# Patient Record
Sex: Female | Born: 1965 | Race: White | Hispanic: No | Marital: Married | Smoking: Former smoker
Health system: Southern US, Community
[De-identification: ages and names within clinical notes are randomized; demographics above are authoritative.]

## PROBLEM LIST (undated history)

## (undated) DIAGNOSIS — F419 Anxiety disorder, unspecified: Secondary | ICD-10-CM

## (undated) DIAGNOSIS — F4310 Post-traumatic stress disorder, unspecified: Secondary | ICD-10-CM

## (undated) DIAGNOSIS — K589 Irritable bowel syndrome without diarrhea: Secondary | ICD-10-CM

## (undated) DIAGNOSIS — I10 Essential (primary) hypertension: Secondary | ICD-10-CM

## (undated) HISTORY — PX: BREAST SURGERY: SHX581

## (undated) HISTORY — PX: ABDOMINAL HYSTERECTOMY: SHX81

## (undated) HISTORY — PX: TONSILLECTOMY: SUR1361

---

## 2008-09-12 ENCOUNTER — Ambulatory Visit: Payer: Self-pay | Admitting: Family Medicine

## 2008-09-12 DIAGNOSIS — I10 Essential (primary) hypertension: Secondary | ICD-10-CM | POA: Insufficient documentation

## 2008-09-12 DIAGNOSIS — R197 Diarrhea, unspecified: Secondary | ICD-10-CM | POA: Insufficient documentation

## 2008-09-12 DIAGNOSIS — R112 Nausea with vomiting, unspecified: Secondary | ICD-10-CM | POA: Insufficient documentation

## 2008-09-12 DIAGNOSIS — E559 Vitamin D deficiency, unspecified: Secondary | ICD-10-CM | POA: Insufficient documentation

## 2008-09-13 LAB — CONVERTED CEMR LAB
ALT: 9 units/L (ref 0–35)
AST: 14 units/L (ref 0–37)
Albumin: 4.2 g/dL (ref 3.5–5.2)
BUN: 11 mg/dL (ref 6–23)
Basophils Relative: 0 % (ref 0–1)
CO2: 22 meq/L (ref 19–32)
Chloride: 108 meq/L (ref 96–112)
Creatinine, Ser: 0.69 mg/dL (ref 0.40–1.20)
Eosinophils Relative: 2 % (ref 0–5)
HCT: 43.8 % (ref 36.0–46.0)
Hemoglobin: 15.1 g/dL — ABNORMAL HIGH (ref 12.0–15.0)
Lymphocytes Relative: 30 % (ref 12–46)
MCHC: 34.5 g/dL (ref 30.0–36.0)
Monocytes Absolute: 0.5 10*3/uL (ref 0.1–1.0)
Monocytes Relative: 5 % (ref 3–12)
Neutro Abs: 5.5 10*3/uL (ref 1.7–7.7)
RBC: 4.51 M/uL (ref 3.87–5.11)
Total Bilirubin: 0.6 mg/dL (ref 0.3–1.2)
Total Protein: 6.8 g/dL (ref 6.0–8.3)
Vit D, 1,25-Dihydroxy: 29 — ABNORMAL LOW (ref 30–89)

## 2008-09-15 ENCOUNTER — Encounter: Payer: Self-pay | Admitting: Family Medicine

## 2008-10-13 ENCOUNTER — Ambulatory Visit: Payer: Self-pay | Admitting: Gastroenterology

## 2008-10-13 DIAGNOSIS — N200 Calculus of kidney: Secondary | ICD-10-CM | POA: Insufficient documentation

## 2008-10-13 DIAGNOSIS — N390 Urinary tract infection, site not specified: Secondary | ICD-10-CM | POA: Insufficient documentation

## 2008-10-13 DIAGNOSIS — Z8719 Personal history of other diseases of the digestive system: Secondary | ICD-10-CM | POA: Insufficient documentation

## 2008-10-13 DIAGNOSIS — R634 Abnormal weight loss: Secondary | ICD-10-CM | POA: Insufficient documentation

## 2008-10-13 DIAGNOSIS — F411 Generalized anxiety disorder: Secondary | ICD-10-CM | POA: Insufficient documentation

## 2008-10-13 LAB — CONVERTED CEMR LAB: CA 125: 3.5 units/mL (ref 0.0–30.2)

## 2008-10-27 ENCOUNTER — Ambulatory Visit: Payer: Self-pay | Admitting: Gastroenterology

## 2012-12-16 ENCOUNTER — Encounter: Payer: Self-pay | Admitting: *Deleted

## 2012-12-16 ENCOUNTER — Emergency Department (INDEPENDENT_AMBULATORY_CARE_PROVIDER_SITE_OTHER): Payer: Self-pay

## 2012-12-16 ENCOUNTER — Ambulatory Visit (INDEPENDENT_AMBULATORY_CARE_PROVIDER_SITE_OTHER): Payer: Self-pay | Admitting: Sports Medicine

## 2012-12-16 ENCOUNTER — Emergency Department
Admission: EM | Admit: 2012-12-16 | Discharge: 2012-12-16 | Disposition: A | Discharge status: Home / Self Care | Payer: Self-pay | Source: Home / Self Care | Attending: Family Medicine | Admitting: Family Medicine

## 2012-12-16 DIAGNOSIS — M25511 Pain in right shoulder: Secondary | ICD-10-CM

## 2012-12-16 DIAGNOSIS — M25519 Pain in unspecified shoulder: Secondary | ICD-10-CM

## 2012-12-16 DIAGNOSIS — M5412 Radiculopathy, cervical region: Secondary | ICD-10-CM

## 2012-12-16 DIAGNOSIS — M542 Cervicalgia: Secondary | ICD-10-CM

## 2012-12-16 HISTORY — DX: Essential (primary) hypertension: I10

## 2012-12-16 HISTORY — DX: Post-traumatic stress disorder, unspecified: F43.10

## 2012-12-16 HISTORY — DX: Irritable bowel syndrome, unspecified: K58.9

## 2012-12-16 HISTORY — DX: Anxiety disorder, unspecified: F41.9

## 2012-12-16 MED ORDER — PREDNISONE 10 MG PO TABS: ORAL_TABLET | ORAL | Status: AC

## 2012-12-16 MED ORDER — ORPHENADRINE CITRATE ER 100 MG PO TB12: 100.0000 mg | ORAL_TABLET | Freq: Two times a day (BID) | ORAL | Status: AC

## 2012-12-16 MED ORDER — MELOXICAM 15 MG PO TABS: ORAL_TABLET | ORAL | Status: AC

## 2012-12-16 NOTE — ED Provider Notes (Signed)
History     CSN: [id number]  Arrival date & time 12/16/12  1113   First MD Initiated Contact with Patient 12/16/12 1114      Chief Complaint  Patient presents with  . Shoulder Pain    right   HPI  Shoulder pain x 1 week.  Predominantly R sided.  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 No known injury.  Hash had limited ROM 2/2 pain.  Has had some radicular sxs down r arm into hand.  Grip strength intact.  No neck pain.  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   Past Medical History  Diagnosis Date  . Hypertension   . IBS (irritable bowel syndrome)   . PTSD (post-traumatic stress disorder)   . Anxiety     Past Surgical History  Procedure Laterality Date  . Breast surgery    . Tonsillectomy    . Abdominal hysterectomy      Family History  Problem Relation Age of Onset  . Lymphoma Mother     History  Substance Use Topics  . Smoking status: Former Smoker    Types: Cigarettes    Quit date: 05/18/2012  . Smokeless tobacco: Not on file  . Alcohol Use: Not on file    OB History   Grav Para Term Preterm Abortions TAB SAB Ect Mult Living                  Review of Systems  All other systems reviewed and are negative.    Allergies  Erythromycin and Sulfonamide derivatives  Home Medications   Current Outpatient Rx  Name  Route  Sig  Dispense  Refill  . clonazePAM (KLONOPIN) 1 MG tablet   Oral   Take 1 mg by mouth 2 (two) times daily as needed for anxiety.         . hydrALAZINE (APRESOLINE) 25 MG tablet   Oral   Take 25 mg by mouth 3 (three) times daily.         . sertraline (ZOLOFT) 100 MG tablet   Oral   Take 100 mg by mouth daily.           BP 129/89  Pulse 74  Ht 5\' 6"  (1.676 m)  Wt 130 lb (58.968 kg)  BMI 20.99 kg/m2  SpO2 99%  Physical Exam  Constitutional: She appears well-developed and well-nourished.  HENT:  Head: Normocephalic and  atraumatic.  Eyes: Conjunctivae are normal. Pupils are equal, round, and reactive to light.  Neck: Normal range of motion.  Cardiovascular: Normal rate and regular rhythm.   Pulmonary/Chest: Effort normal.  Abdominal: Soft.  Musculoskeletal:  + pain with external rotation  + pain with arm abduction >60  Neurovascularly intact distally  Grip strength intact spurlings negative.  Mild pain with general neck rotation that lateralizes to either side.    Neurological: She is alert.  Skin: Skin is warm.    ED Course  Procedures (including critical care time)  Labs Reviewed - No data to display Dg Cervical Spine Complete  12/16/2012  *RADIOLOGY REPORT*  Clinical Data: Neck and shoulder pain, no injury  CERVICAL SPINE - COMPLETE 4+ VIEW  Comparison: None.  Findings: The cervical vertebrae are straightened in alignment. Intervertebral disc spaces appear normal.  No prevertebral soft tissue swelling is seen.  On oblique views no significant foraminal narrowing is noted.  The lung apices are clear.  The odontoid  process is intact.  IMPRESSION: Straightened alignment.  Normal disc spaces.  No acute abnormality.   Original Report Authenticated By: Dwyane Dee, M.D.    Dg Shoulder Right  12/16/2012  *RADIOLOGY REPORT*  Clinical Data: Shoulder and neck pain, no injury  RIGHT SHOULDER - 2+ VIEW  Comparison: None.  Findings: The right humeral head is normally positioned within the glenohumeral joint space.  No significant degenerative change is seen and the right [facility] joint is normally aligned.  IMPRESSION: Negative.   Original Report Authenticated By: Dwyane Dee, M.D.      1. Pain in joint, shoulder region, right       MDM  Ddx includes rotator cuff pathology vs. Cervical radiculopathy.  Will likely benefit from course of oral steroids.  Will consult sports medicine as they are available.  Treatment and follow up per sports medicine.     The patient and/or caregiver has been counseled  thoroughly with regard to treatment plan and/or medications prescribed including dosage, schedule, interactions, rationale for use, and possible side effects and they verbalize understanding. Diagnoses and expected course of recovery discussed and will return if not improved as expected or if the condition worsens. Patient and/or caregiver verbalized understanding.             Doree Albee, MD 12/16/12 1246

## 2012-12-16 NOTE — Progress Notes (Signed)
  Subjective:    I'm seeing this patient as a consultation for:  Dr. Alvester Morin  CC: Right shoulder pain   HPI: Theresa Adams is a very pleasant 47 year old female with several year history of on-and-off pain she localizes over her right trapezius radiating down the lateral aspect of her right upper arm, forearm, and causing numbness and tingling in the first 3 fingers. This bothers her significantly at night. Symptoms are worse with most neck movements, but also reproduced with overhead activities of the right shoulder. She's used to oral analgesics, and has been using some Flexeril and she has left over at home but unfortunately has not yet had any relief. She denies any bowel or bladder changes or saddle anesthesia or constitutional symptoms.  Past medical history, Surgical history, Family history not pertinant except as noted below, Social history, Allergies, and medications have been entered into the medical record, reviewed, and no changes needed.   Review of Systems: No headache, visual changes, nausea, vomiting, diarrhea, constipation, dizziness, abdominal pain, skin rash, fevers, chills, night sweats, weight loss, swollen lymph nodes, body aches, joint swelling, muscle aches, chest pain, shortness of breath, mood changes, visual or auditory hallucinations.   Objective:   General: Well Developed, well nourished, and in no acute distress.  Neuro/Psych: Alert and oriented x3, extra-ocular muscles intact, able to move all 4 extremities, sensation grossly intact. Skin: Warm and dry, no rashes noted.  Respiratory: Not using accessory muscles, speaking in full sentences, trachea midline.  Cardiovascular: Pulses palpable, no extremity edema. Abdomen: Does not appear distended. Neck: Inspection unremarkable. No palpable stepoffs. Positive Spurling's maneuver. Full neck range of motion Grip strength and sensation normal in bilateral hands Strength weak to right C5 and C6. Mild hypoesthesia in the C5  and C6 distribution on the right side. Negative Hoffman sign bilaterally Reflexes normal She did have reproduction of pain with some of the rotator cuff impingement tests.  X-rays show mid cervical degenerative disc disease with abnormal curvature of the cervical spine suggesting spasm.  Shoulder x-rays were unremarkable.  Impression and Recommendations:   This case required medical decision making of moderate complexity.

## 2012-12-16 NOTE — ED Notes (Signed)
Theresa Adams c/o right shoulder pain without known cause x 5-6 days. Limited ROM, burning radiates down right arm. Used Aleve, flexeril, ultram, ice and heat without relief.

## 2012-12-16 NOTE — Assessment & Plan Note (Signed)
Symptoms predominately represent a C5 or C6 right-sided cervical radiculitis. She also has some rotator cuff symptoms. Prednisone, Norflex, formal physical therapy, Mobic. Return in 4 weeks, MRI if no better.

## 2012-12-17 ENCOUNTER — Ambulatory Visit: Payer: Self-pay | Admitting: Physical Therapy

## 2012-12-18 ENCOUNTER — Ambulatory Visit: Payer: Self-pay | Admitting: Physical Therapy

## 2012-12-23 ENCOUNTER — Ambulatory Visit: Payer: Self-pay | Admitting: Physical Therapy

## 2012-12-30 ENCOUNTER — Ambulatory Visit: Payer: Self-pay | Admitting: Physical Therapy

## 2013-11-02 ENCOUNTER — Encounter: Payer: Self-pay | Admitting: Emergency Medicine

## 2013-11-02 ENCOUNTER — Emergency Department
Admission: EM | Admit: 2013-11-02 | Discharge: 2013-11-02 | Disposition: A | Discharge status: Home / Self Care | Payer: Self-pay | Source: Home / Self Care | Attending: Family Medicine | Admitting: Family Medicine

## 2013-11-02 DIAGNOSIS — H6690 Otitis media, unspecified, unspecified ear: Secondary | ICD-10-CM

## 2013-11-02 MED ORDER — AMOXICILLIN 875 MG PO TABS: 875.0000 mg | ORAL_TABLET | Freq: Two times a day (BID) | ORAL | Status: AC

## 2013-11-02 MED ORDER — NEOMYCIN-POLYMYXIN-HC 3.5-10000-1 OT SOLN: 3.0000 [drp] | Freq: Four times a day (QID) | OTIC | Status: AC

## 2013-11-02 NOTE — ED Notes (Signed)
Pt c/o LT ear pain x today. She also c/o URI s/s x 2 days. Denies fever.

## 2013-11-25 NOTE — ED Provider Notes (Signed)
CSN: [id number]     Arrival date & time 11/02/13  1525 History   First MD Initiated Contact with Patient 11/02/13 1529     Chief Complaint  Patient presents with  . Otalgia     HPI  EAR PAIN Location:  L ear  Description: L ear pain and discomfort Onset:  1 day  Modifying factors: none   Symptoms  Sensation of fullness: no Ear discharge: no URI symptoms: minimal   Fever: no Tinnitus:no   Dizziness:no   Hearing loss:no   Toothache: no Rashes or lesions: no Facial muscle weakness: no  Red Flags Recent trauma: no PMH prior ear surgery:  no Diabetes or Immunosuppresion: no    Past Medical History  Diagnosis Date  . Hypertension   . IBS (irritable bowel syndrome)   . PTSD (post-traumatic stress disorder)   . Anxiety    Past Surgical History  Procedure Laterality Date  . Breast surgery    . Tonsillectomy    . Abdominal hysterectomy     Family History  Problem Relation Age of Onset  . Lymphoma Mother    History  Substance Use Topics  . Smoking status: Former Smoker    Types: Cigarettes    Quit date: 05/18/2012  . Smokeless tobacco: Not on file  . Alcohol Use: Yes   OB History   Grav Para Term Preterm Abortions TAB SAB Ect Mult Living                 Review of Systems  All other systems reviewed and are negative.      Allergies  Erythromycin and Sulfonamide derivatives  Home Medications   Current Outpatient Rx  Name  Route  Sig  Dispense  Refill  . amoxicillin (AMOXIL) 875 MG tablet   Oral   Take 1 tablet (875 mg total) by mouth 2 (two) times daily.   20 tablet   0   . clonazePAM (KLONOPIN) 1 MG tablet   Oral   Take 1 mg by mouth 2 (two) times daily as needed for anxiety.         . hydrALAZINE (APRESOLINE) 25 MG tablet   Oral   Take 25 mg by mouth 3 (three) times daily.         . meloxicam (MOBIC) 15 MG tablet      One tab PO qAM with breakfast for 2 weeks, then daily prn pain.   30 tablet   3   . orphenadrine (NORFLEX) 100  MG tablet   Oral   Take 1 tablet (100 mg total) by mouth 2 (two) times daily.   60 tablet   0   . predniSONE (DELTASONE) 10 MG tablet      4 tabs po daily x 6 days, then 3 tabs po daily x 4 days, then 2 tabs po daily x 4 days, then 1 tab po daily x 4 days   48 tablet   0   . sertraline (ZOLOFT) 100 MG tablet   Oral   Take 100 mg by mouth daily.          BP 150/89  Pulse 74  Temp(Src) 98 F (36.7 C) (Oral)  Resp 16  Ht 5\' 6"  (1.676 m)  Wt 129 lb (58.514 kg)  BMI 20.83 kg/m2  SpO2 99% Physical Exam  Constitutional: She is oriented to person, place, and time. She appears well-developed and well-nourished.  HENT:  Head: Normocephalic and atraumatic.  L ear TM bulging   Eyes:  Conjunctivae are normal. Pupils are equal, round, and reactive to light.  Neck: Normal range of motion. Neck supple.  Cardiovascular: Normal rate and regular rhythm.   Pulmonary/Chest: Effort normal and breath sounds normal.  Abdominal: Soft.  Neurological: She is alert and oriented to person, place, and time.  Skin: Skin is warm.    ED Course  Procedures (including critical care time) Labs Review Labs Reviewed - No data to display Imaging Review No results found.    MDM   Final diagnoses:  AOM (acute otitis media)    Will treat with amox Discussed supportive care and infectious/ENT red flags.  Follow up as needed.     The patient and/or caregiver has been counseled thoroughly with regard to treatment plan and/or medications prescribed including dosage, schedule, interactions, rationale for use, and possible side effects and they verbalize understanding. Diagnoses and expected course of recovery discussed and will return if not improved as expected or if the condition worsens. Patient and/or caregiver verbalized understanding.         Doree Albee, MD 11/25/13 [phone]

## 2013-12-30 ENCOUNTER — Ambulatory Visit: Payer: Self-pay | Admitting: Physical Therapy

## 2014-02-14 IMAGING — CR DG CERVICAL SPINE COMPLETE 4+V
6 series · 6 of 6 positions shown · non-contrast
Comparison: None.

CLINICAL DATA: Neck and shoulder pain, no injury

CERVICAL SPINE - COMPLETE 4+ VIEW

[view not recorded (1 of 6)]
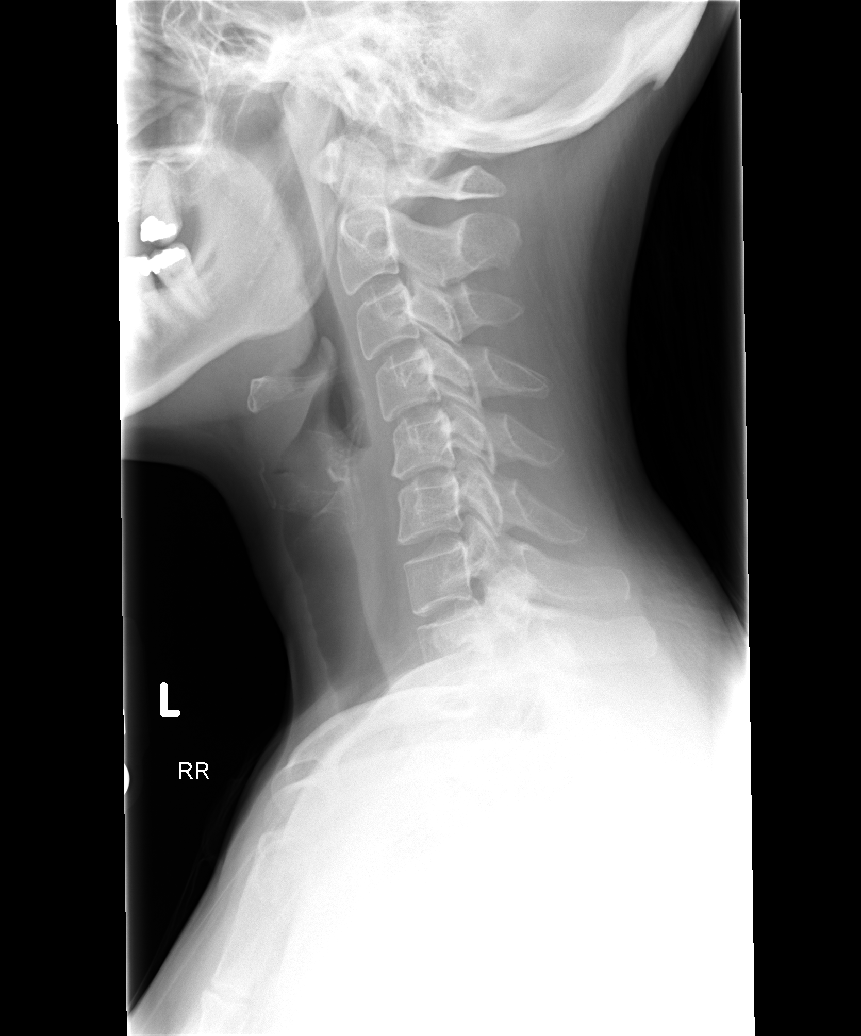

[view not recorded (2 of 6)]
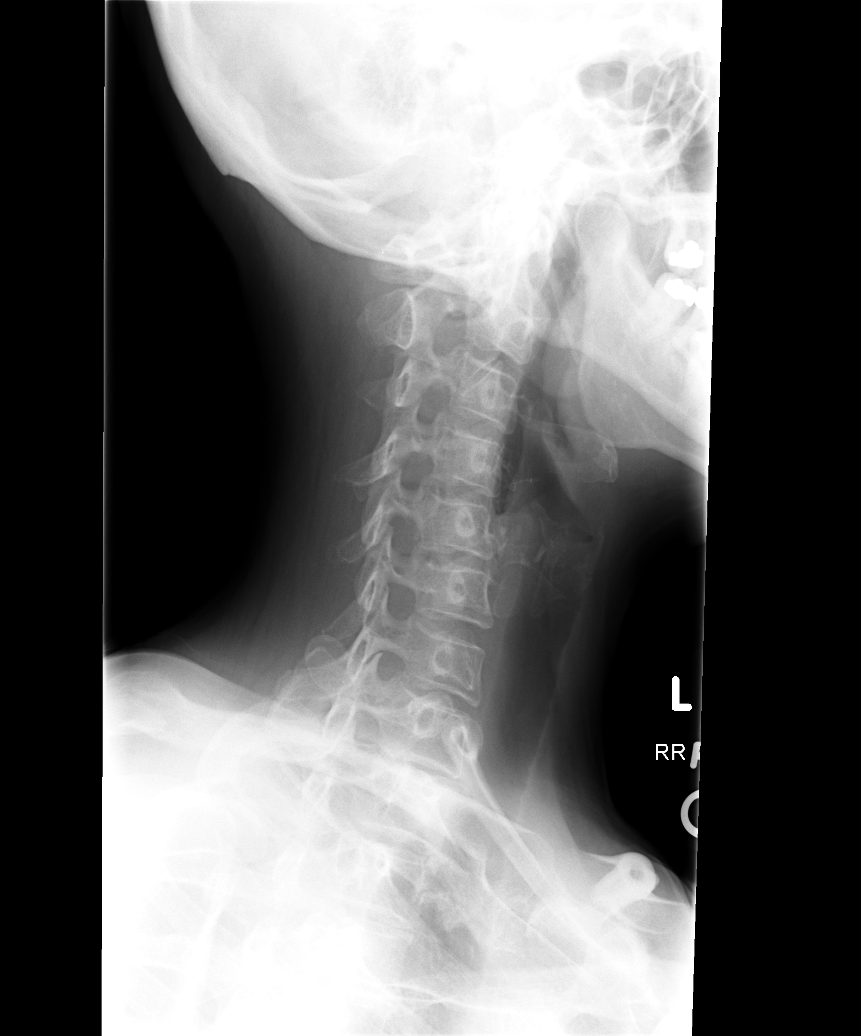

[view not recorded (3 of 6)]
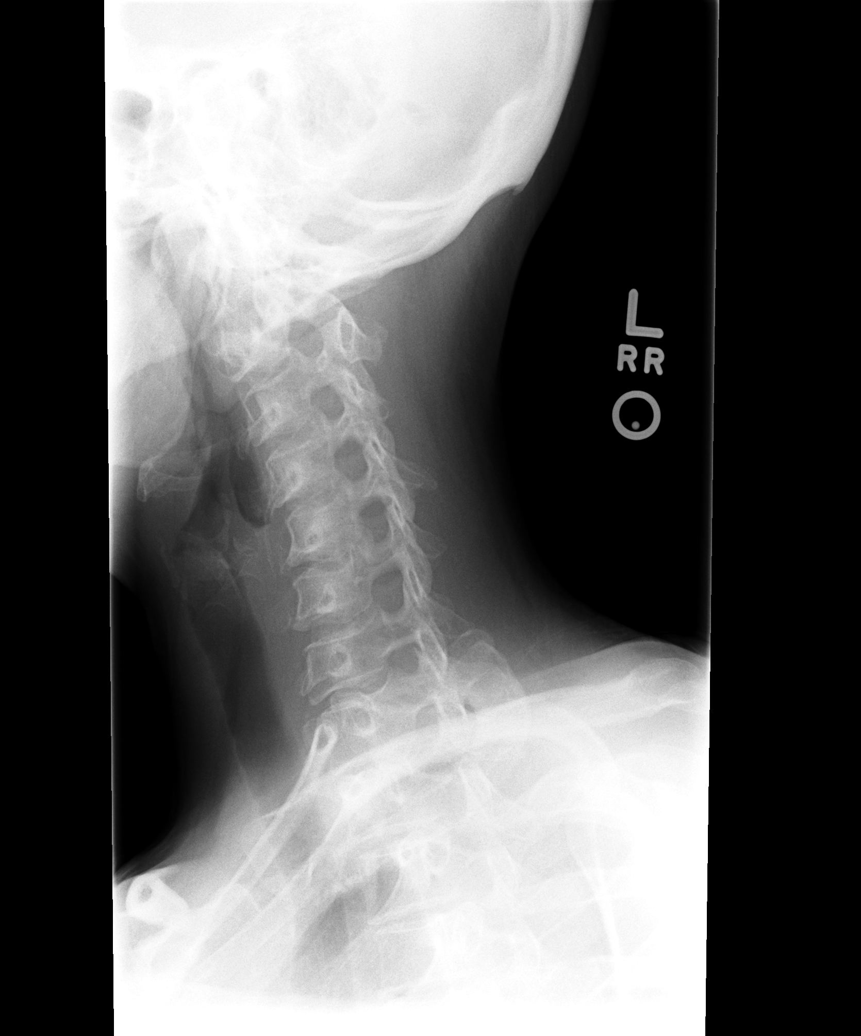

[view not recorded (4 of 6)]
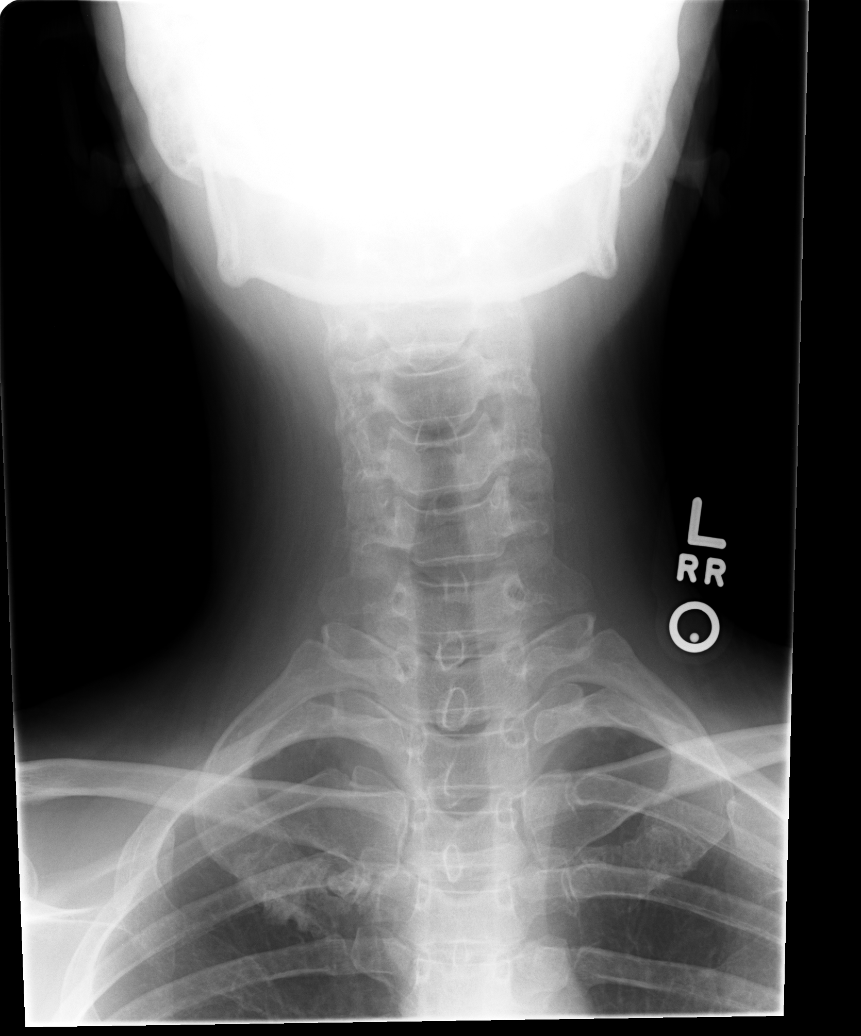

[view not recorded (5 of 6)]
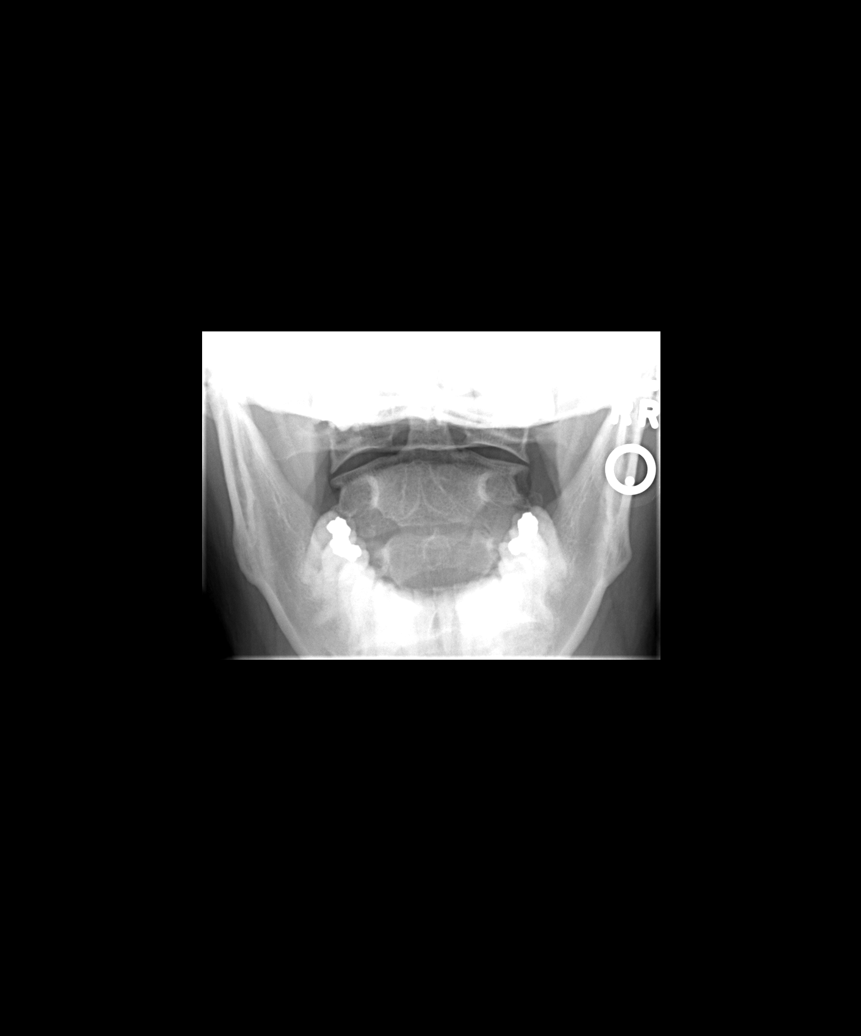

[view not recorded (6 of 6)]
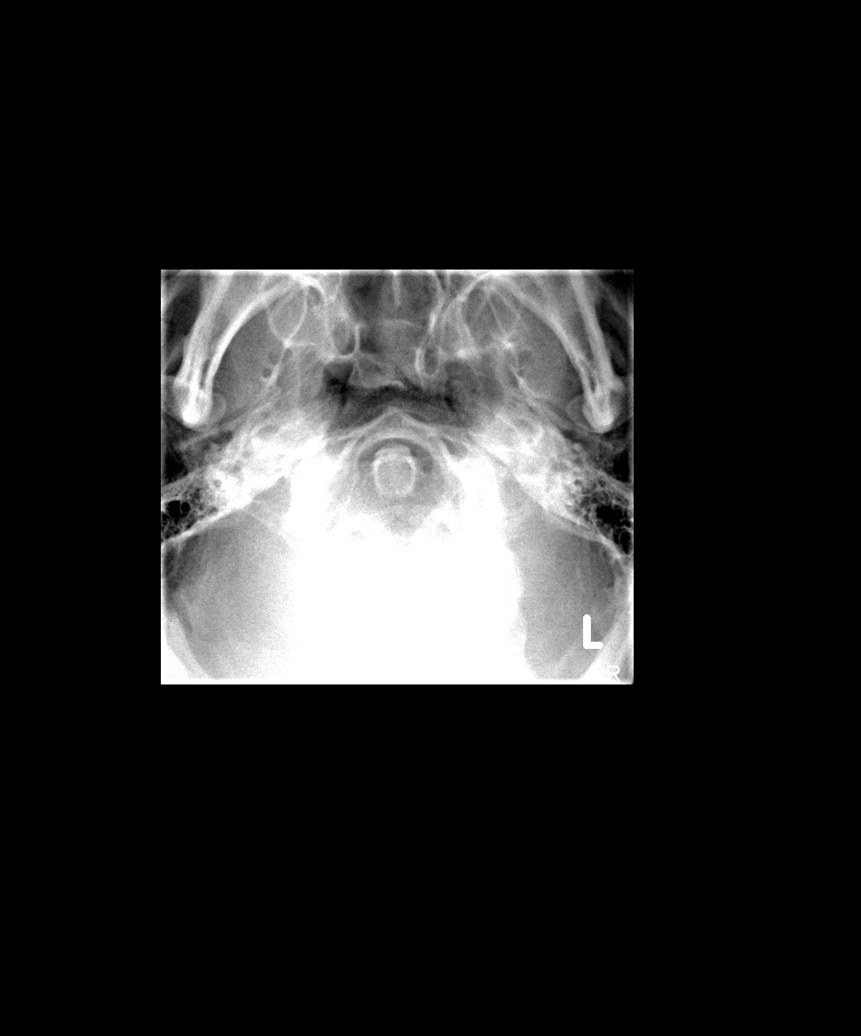

[6 of 6 positions shown; findings below may reference images not displayed]

FINDINGS: The cervical vertebrae are straightened in alignment.
Intervertebral disc spaces appear normal.  No prevertebral soft
tissue swelling is seen.  On oblique views no significant foraminal
narrowing is noted.  The lung apices are clear.  The odontoid
process is intact.
IMPRESSION: Straightened alignment.  Normal disc spaces.  No acute abnormality.

## 2014-02-14 IMAGING — CR DG SHOULDER 2+V*R*
3 series · 3 of 3 positions shown · non-contrast
Comparison: None.

CLINICAL DATA: Shoulder and neck pain, no injury

RIGHT SHOULDER - 2+ VIEW

[view not recorded (1 of 3)]
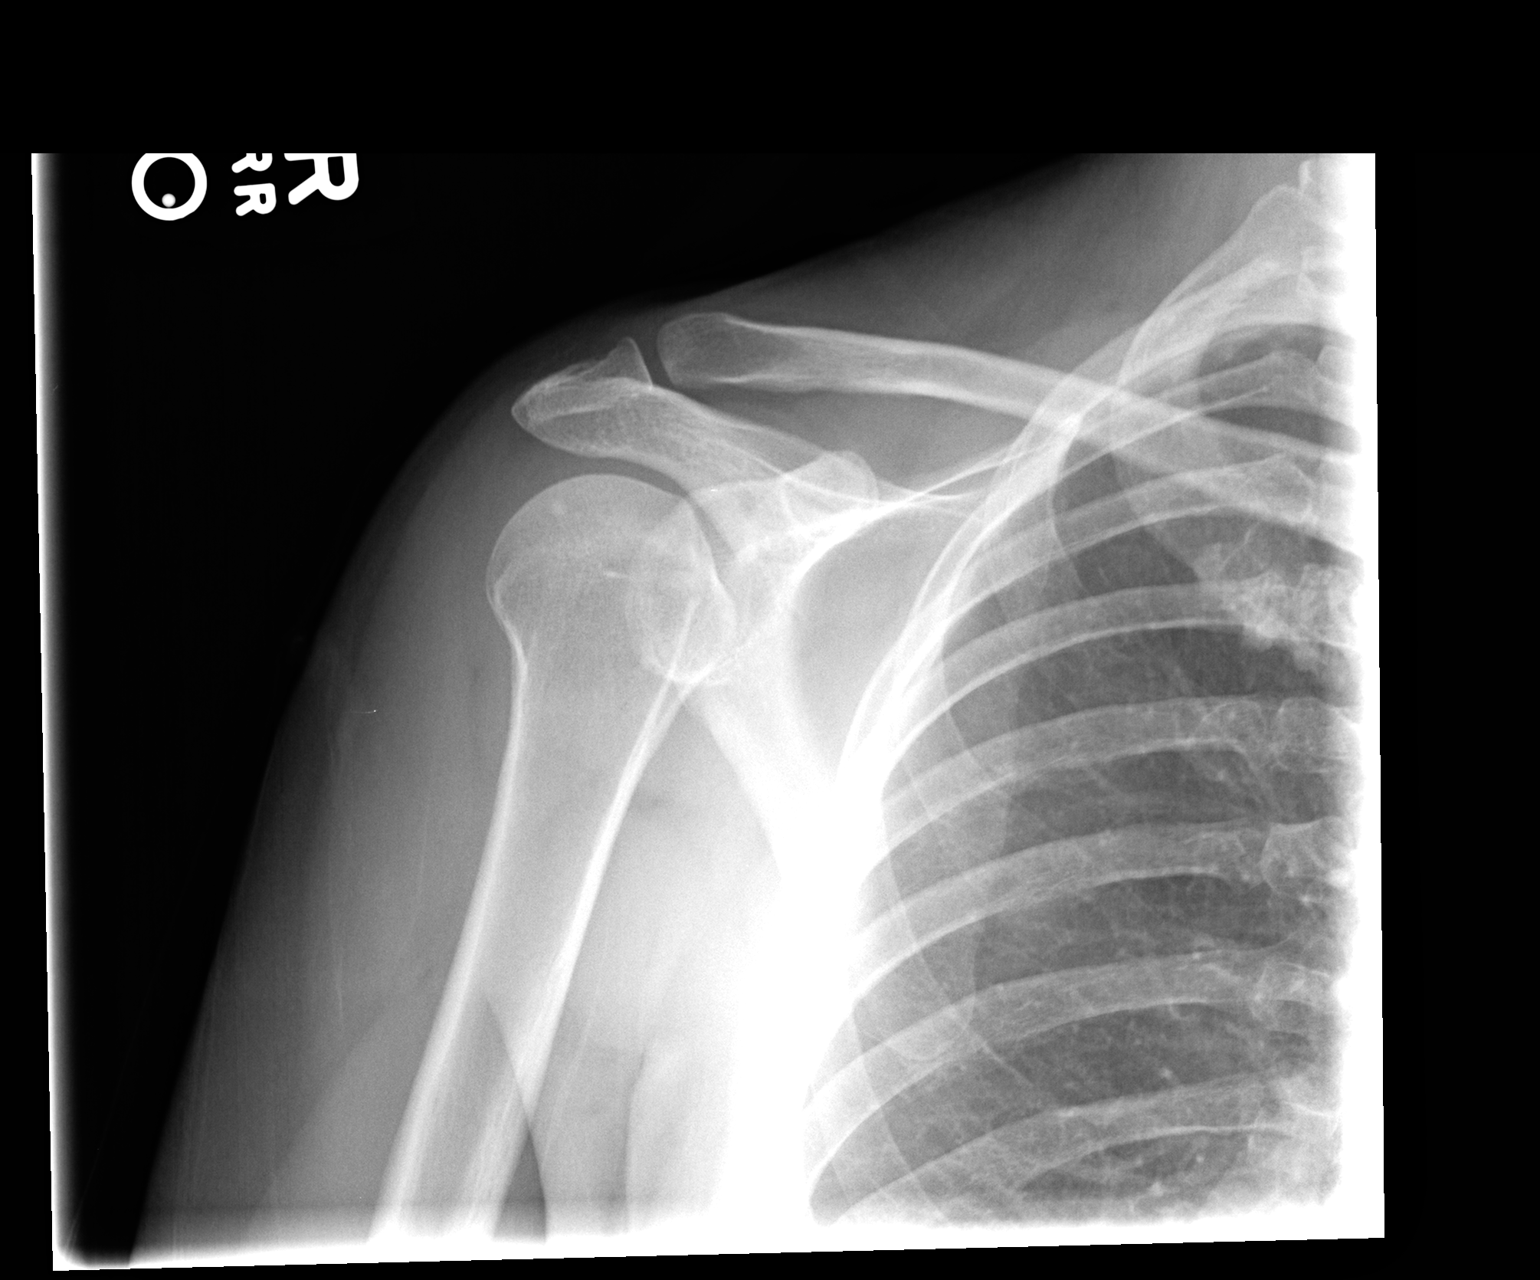

[view not recorded (2 of 3)]
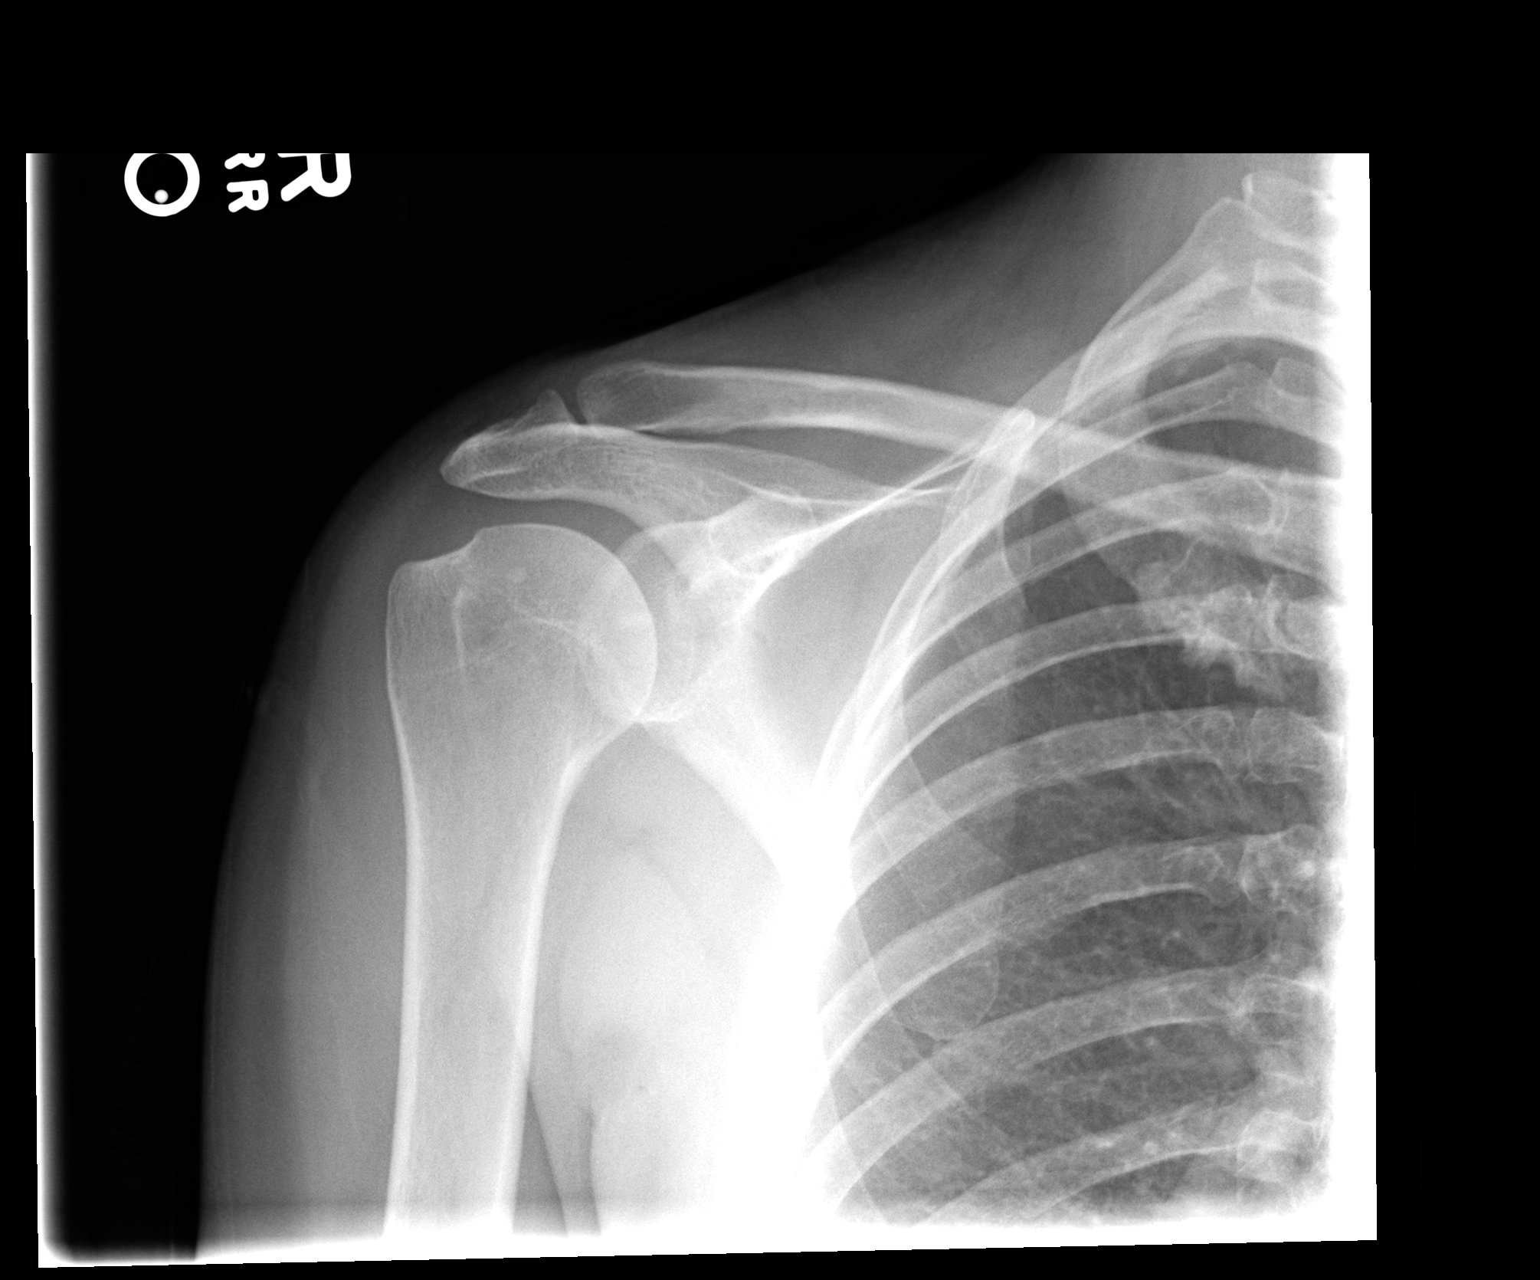

[view not recorded (3 of 3)]
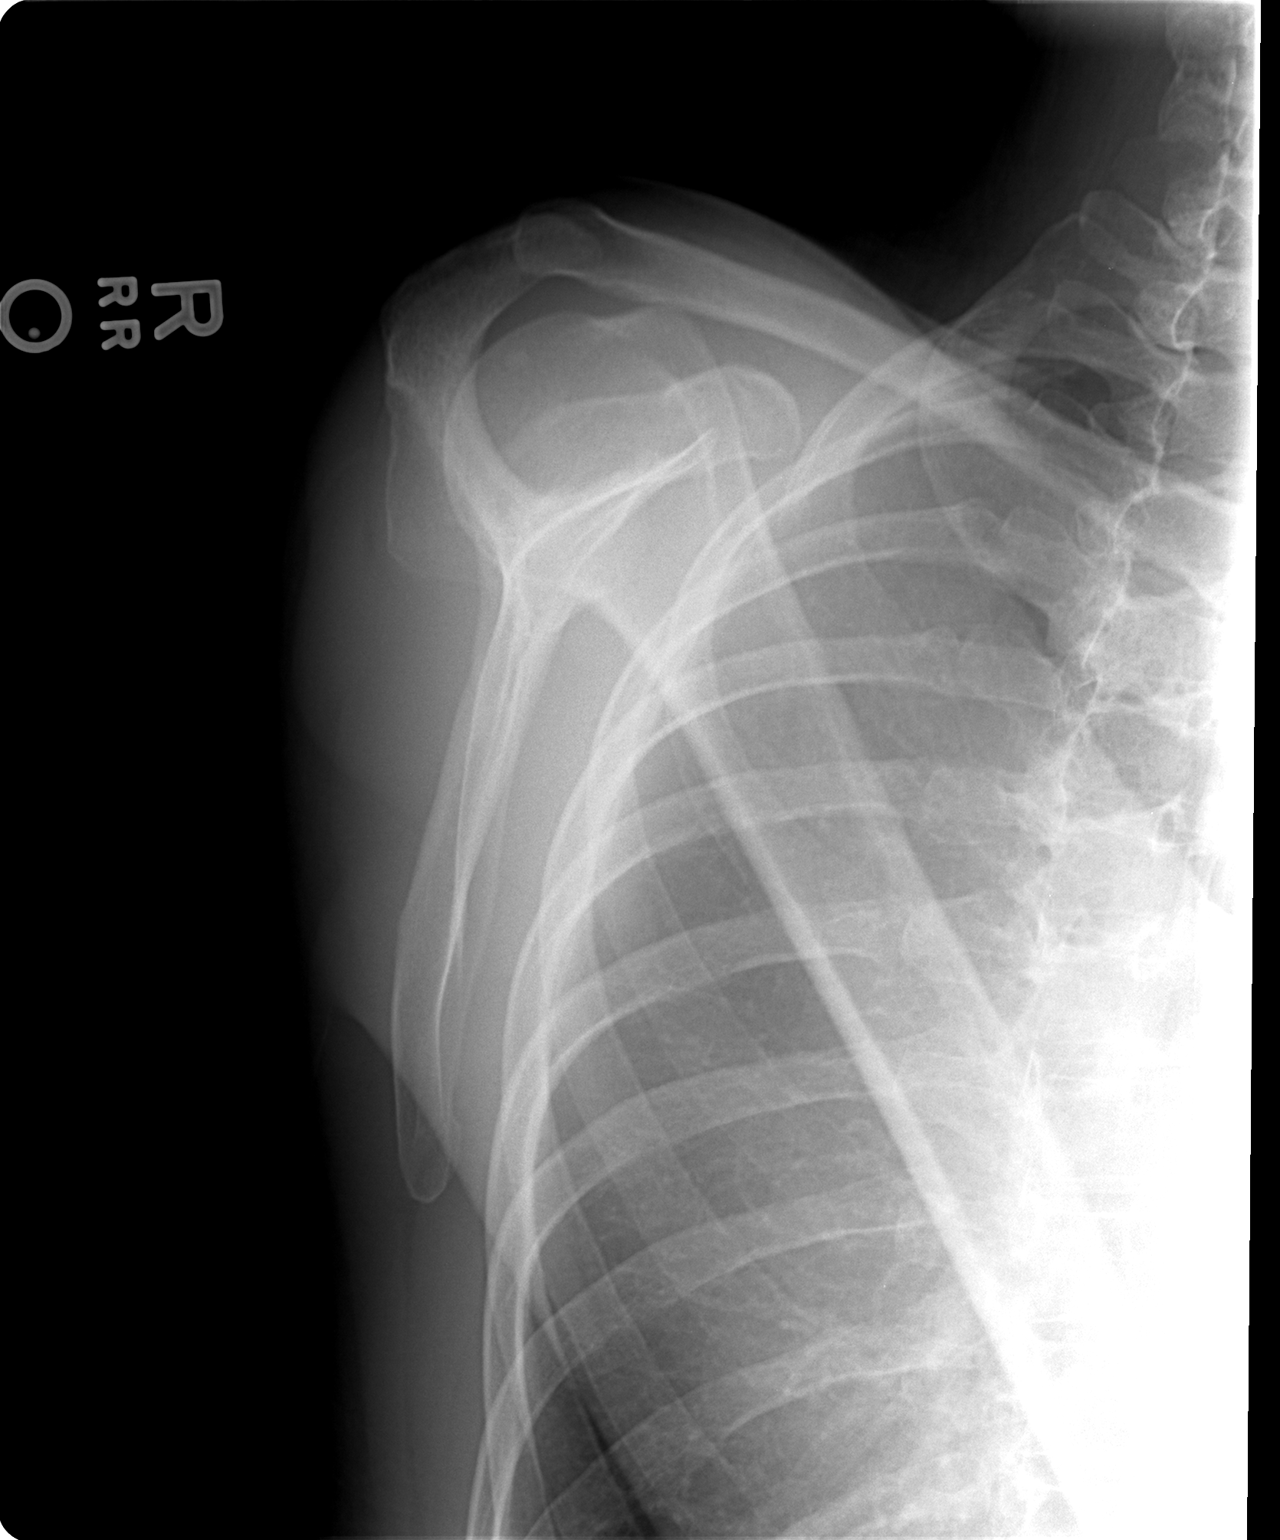

[3 of 3 positions shown; findings below may reference images not displayed]

FINDINGS: The right humeral head is normally positioned within the
glenohumeral joint space.  No significant degenerative change is
seen and the right AC joint is normally aligned.
IMPRESSION: Negative.
